# Patient Record
Sex: Female | Born: 1985 | Hispanic: Yes | Marital: Single | Smoking: Former smoker
Health system: Southern US, Community
[De-identification: ages and names within clinical notes are randomized; demographics above are authoritative.]

## PROBLEM LIST (undated history)

## (undated) HISTORY — PX: TONSILLECTOMY: SUR1361

---

## 2014-01-21 ENCOUNTER — Encounter (HOSPITAL_COMMUNITY): Payer: Self-pay | Admitting: Emergency Medicine

## 2014-01-21 ENCOUNTER — Emergency Department (HOSPITAL_COMMUNITY)
Admission: EM | Admit: 2014-01-21 | Discharge: 2014-01-21 | Disposition: A | Discharge status: Home / Self Care | Payer: Medicaid Other | Source: Home / Self Care | Attending: Emergency Medicine | Admitting: Emergency Medicine

## 2014-01-21 DIAGNOSIS — R51 Headache: Secondary | ICD-10-CM

## 2014-01-21 DIAGNOSIS — R519 Headache, unspecified: Secondary | ICD-10-CM

## 2014-01-21 DIAGNOSIS — G5600 Carpal tunnel syndrome, unspecified upper limb: Secondary | ICD-10-CM

## 2014-01-21 MED ORDER — PREDNISONE 10 MG PO TABS: ORAL_TABLET | ORAL | Status: DC

## 2014-01-21 MED ORDER — KETOROLAC TROMETHAMINE 60 MG/2ML IM SOLN
INTRAMUSCULAR | Status: AC
  Filled 2014-01-21: qty 2

## 2014-01-21 MED ORDER — KETOROLAC TROMETHAMINE 60 MG/2ML IM SOLN
60.0000 mg | Freq: Once | INTRAMUSCULAR | Status: AC
Start: 2014-01-21 — End: 2014-01-21
  Administered 2014-01-21: 60 mg via INTRAMUSCULAR

## 2014-01-21 MED ORDER — TRAMADOL HCL 50 MG PO TABS: 100.0000 mg | ORAL_TABLET | Freq: Three times a day (TID) | ORAL | Status: DC | PRN

## 2014-01-21 MED ORDER — METHYLPREDNISOLONE ACETATE 80 MG/ML IJ SUSP
80.0000 mg | Freq: Once | INTRAMUSCULAR | Status: AC
  Administered 2014-01-21: 80 mg via INTRAMUSCULAR

## 2014-01-21 MED ORDER — METHYLPREDNISOLONE ACETATE 80 MG/ML IJ SUSP
INTRAMUSCULAR | Status: AC
  Filled 2014-01-21: qty 1

## 2014-01-21 NOTE — Discharge Instructions (Signed)
Carpal tunnel syndrome is caused by compression of the median nerve in the wrist.  Most often, inflammation of the tendons that pass through the carpal tunnel and surround the median nerve is the causative factor. ° °Wear your wrist splint as much as possible, especially at night.   ° °The following exercises should be done twice daily: ° °Exercises for Carpal Tunnel Syndrome  °Wrists Exercise 1. °• Make a loose right fist, palm up, and use the left hand to press gently down against the clenched hand. °• Resist the force with the closed right hand for 5 seconds. Be sure to keep the wrist straight. °• Turn the right fist palm down, and press the knuckles against the left open palm for 5 seconds. °• Finally, turn the right palm so the thumb-side of the fist is up, and press down again for 5 seconds. °• Repeat with the left hand.  ° Exercise 2. °• Hold one hand straight up shoulder-high with fingers together and palm facing outward. (The position looks like a shoulder-high salute.) °• With the other hand, bend the hand being exercised backward with the fingers still held together and hold for 5 seconds. °• Spread the fingers and thumb open while the hand is still bent back and hold for 5 seconds. °• Repeat five times for each hand.  ° Exercise 3. (Wrist Circle) °• Hold the second and third fingers up, and close the others. °• Draw five clockwise circles in the air with the two finger tips. °• Draw five more counterclockwise circles. °• Repeat with the other hand.  °Fingers and Hand Exercise 1. °• Clench the fingers of one hand into a fist tightly. °• Release, fanning out the fingers. °• Do this five times. Repeat with the other hand.  ° Exercise 2. °• To exercise the thumb, bend it against the palm beneath the little finger, and hold for 5 seconds. °• Spread the fingers apart, palm up, and hold for 5 seconds. °• Repeat five to 10 times with each hand.  ° Exercise 3. °• Gently pull the thumb out and back and hold for 5  seconds. °• Repeat five to 10 times with each hand.  °Forearms (stretching these muscles will reduce tension in the wrist) • Place the hands together in front of the chest, fingers pointed upward in a prayer-like position. °• Keeping the palms flat together, raise the elbows to stretch the forearm muscles. °• Stretch for 10 seconds. °• Gently shake the hands limp for a few seconds to loosen them. °• Repeat frequently when the hands or arms tire from activity.  °Neck and Shoulders Exercise 1. °• Sit upright and place the right hand on top of the left shoulder. °• Hold that shoulder down, and slowly tip the head down toward the right. °• Keep the face pointed forward, or even turned slightly toward the right. °• Hold this stretch gently for 5 seconds. °• Repeat on the other side.  ° Exercise 2. °• Stand in a relaxed position with the arms at the side. °• Shrug the shoulders up, then squeeze the shoulders back, then stretch the shoulders down, and then press them forward. °• The entire exercise should take about 7 seconds.  ° ° °If you are employed in a job that requires repetitive hand or wrist movement (this includes keyboarding or use of a computer mouse) paying attention to work ergonomics is of utmost importance: ° °Preventing CTS in Keyboard Workers °Altering the way a person performs repetitive   activities may help prevent inflammation in the hand and wrist. Most of the interventions described below have been found to reduce repetitive motion problems in the muscles and tendons of the hand and arm. They may reduce the incidence of carpal tunnel syndrome, although there is no definite proof of this effect. °Replacing old tools with ergonomically designed new ones can be very helpful. °Rest Periods and Avoiding Repetition. Anyone who does repetitive tasks should begin with a short warm-up period, take frequent breaks, and avoid overexertion of the hand and finger muscles whenever possible. Employers should be urged  to vary the tasks and work content of their employees. °Taking multiple "microbreaks" (about 3 minutes each) reduces strain and discomfort without decreasing productivity. Such breaks may include the following: °• Shaking or stretching the limbs °• Leaning back in the chair °• Squeezing the shoulder blades together. °• Taking deep breaths °Good Posture. Good posture is extremely important in preventing carpal tunnel syndrome, particularly for typists and computer users. °• The worker should sit with the spine against the back of the chair with the shoulders relaxed. °• The elbows should rest along the sides of the body, with wrists straight. °• The feet should be firmly on the floor or on a footrest. °• Typing materials should be at eye level so that the neck does not bend over the work. °• Keeping the neck flexible and head upright maintains circulation and nerve function to the arms and hands. One method for finding the correct head position is the "pigeon" movement. Keeping the chin level, glide the head slowly and gently forward and backward in small movements, avoiding neck discomfort. °Good Office Furniture. Poorly designed office furniture is a major contributor to bad posture. Chairs should be adjustable for height, with a supportive backrest. Custom-designed chairs, made for people who do not fit in standard chairs, can be expensive. However, the costs are often offset by the savings in medical expenses that follow injuries related to bad posture. °Voice Recognition Software. For CTS patients who must use a computer frequently, a variety of voice recognition software packages (ViaVoice, Voice Xpress, Dragon NaturallySpeaking, IListen) are now available, enabling virtually hands-free computer use. °Keyboard and Mouse Tips. Anyone using a keyboard and mouse has some options that may help protect the hands. °• The tension of the keys should be adjusted so they can be depressed without excessive force. °• The  hands and wrists should remain in a relaxed position to avoid excessive force on the keyboard. °• A 2003 study suggested that mouse-use poses a higher risk than keyboard use. Replacing the mouse with a trackball device and the standard keyboard with a jointed-type keyboard are helpful substitutions. °• Wrist rests, which fit under most keyboards, can help keep the wrists and fingers in a comfortable position. °• Some people recommend keeping the computer mouse as close to the keyboard and the user's body as possible, to reduce shoulder muscle movement. °• The mouse should be held lightly, with the wrist and forearm relaxed. New mouse supports are also available that relieve stress on the hand and support the wrist. °• Some people cut their mouse pads in half to reduce movement. °Innovative keyboard designs may reduce hand stress: °• Alternative geometry keyboards (Microsoft Natural Keyboard, Apple Adjustable Keyboard) allow the user to adjust and modify hand positions as well as adjust key tension. Most have a split or "slanted" keyboard that places the wrists at an angle. Studies suggest they are useful in promoting a neutral position   for the wrist. °• The continuous passive motion (CPM) keyboard lifts and declines gently and automatically every 3 minutes to break tension on the hands and wrist. °• A keyless keyboard (orbiTouch) is an innovative device that uses two domes. The typist covers the domes with their hands and slides them into different positions that represent letters. °Reducing Force from Hand Tools °The force placed on the fingers, hands, and wrists by a repetitive task is an important contributor to CTS. To alleviate the effect of force on the wrist, tools and tasks should be designed so that the wrist position is the same as it would be if the arms dangled in a relaxed manner at the sides. °• No task should require the wrist to deviate from side to side or to remain flexed or highly extended for  long periods. °• The handles of hand tools such as screwdrivers, scrapers, paint brushes, and buffers should be designed so that the force of the worker's grip is distributed across the muscle between the base of the thumb and the little finger, not just in the center of the palm. °• People who need to hold tools (including pencils and steering wheels) for long periods of time should grip them as loosely as possible. °• In order to apply force appropriately, the ability to feel an object is extremely important. Tools with textured handles are helpful. °• If possible, people should avoid working at low temperatures, which reduces sensation in hands and fingers. °• Power tools and machines should be designed to minimize vibrations. °• Wearing thick gloves, when possible, may lessen the shock transmitted to the hands and wrists. ° ° ° °

## 2014-01-21 NOTE — ED Notes (Signed)
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 Onset 2 weeks ago of hand pain and numbness and pain in occipital area

## 2014-01-21 NOTE — ED Provider Notes (Signed)
Chief Complaint    Chief Complaint  Patient presents with  . Hand Pain    History of Present Illness     Virginia Leach is a 28 year old female who has had a two-week history of bilateral hand pain. 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 She's using her hands constantly during the daytime for chopping. The hand pain is very severe and it feels like electric shocks. The pain radiates up the arms towards his shoulders. She finds it hard to grasp. The pain is worse at nighttime. She's had numbness and tingling in the hands. Her neck feels sore and she's had a headache when she gets the hand pain. Sometimes she has some light sensitivity with the headaches but denies any nausea, vomiting, or phonophobia. No history of migraine headaches. She has no history of diabetes vitamin B12 deficiency.  Review of Systems     Other than as noted above, the patient denies any of the following symptoms: Systemic:  No fevers, chills, sweats, or muscle aches.  No weight loss.  Musculoskeletal:  No joint pain, arthritis, bursitis, swelling, back pain, or neck pain. Neurological:  No muscular weakness, paresthesias, headache, or trouble with speech or coordination.  No dizziness.  PMFSH    Past medical history, family history, social history, meds, and allergies were reviewed.    Physical Exam    Vital signs:  BP 123/59  Pulse 58  Temp(Src) 98.3 F (36.8 C) (Oral)  Resp 16  SpO2 97% Gen:  Alert and oriented times 3.  In no distress. Musculoskeletal: She has severe pain to palpation both over the carpal tunnels on the palms of her hands extending up into the forearms. And there is no pain over the wrist dorsally, the MCP joints, PIP, DIP joints. Elbows and shoulders have full range of motion with no pain in the neck has a full range of motion with no pain Tinel's and Phalen's signs were positive bilaterally. She is diminished muscle strength, and sensation is intact. There is no  swelling her pulses are full.  Otherwise, all joints had a full a ROM with no swelling, bruising or deformity.  No edema, pulses full. Extremities were warm and pink.  Capillary refill was brisk.  Skin:  Clear, warm and dry.  No rash. Neuro:  Alert and oriented times 3.  Muscle strength was normal.  Sensation was intact to light touch.    Course in Urgent Care Center   She was given Toradol 60 mg IM and Depo-Medrol 80 mg IM. She was placed in wrist splints bilaterally.  Assessment    The primary encounter diagnosis was Carpal tunnel syndrome. A diagnosis of Headache was also pertinent to this visit.  Plan   1.  Meds:  The following meds were prescribed:   Discharge Medication List as of 01/21/2014  3:23 PM    START taking these medications   Details  predniSONE (DELTASONE) 10 MG tablet 3 daily for 10 days, 2 daily for 10 days, then 1 daily for 10 days, Normal    traMADol (ULTRAM) 50 MG tablet Take 2 tablets (100 mg total) by mouth every 8 (eight) hours as needed., Starting 01/21/2014, Until Discontinued, Normal        2.  Patient Education/Counseling:  The patient was given appropriate handouts, self care instructions, and instructed in symptomatic relief, including rest and activity, elevation, application of ice and compression.  Given exercises to do for carpal tunnel syndrome.  3.  Follow up:  The patient was told to follow up here if no better in 3 to 4 days, or sooner if becoming worse in any way, and given some red flag symptoms such as worsening pain or new neurological symptoms which would prompt immediate return.  Follow up with Dr. Izora Ribas in 2 weeks.     Reuben Likes, MD 01/21/14 2052

## 2015-12-20 ENCOUNTER — Encounter: Payer: Self-pay | Admitting: *Deleted

## 2016-01-15 ENCOUNTER — Other Ambulatory Visit (HOSPITAL_COMMUNITY)
Admission: RE | Admit: 2016-01-15 | Discharge: 2016-01-15 | Disposition: A | Discharge status: Home / Self Care | Payer: Medicaid Other | Source: Ambulatory Visit | Attending: Family Medicine | Admitting: Family Medicine

## 2016-01-15 ENCOUNTER — Encounter: Payer: Self-pay | Admitting: Family Medicine

## 2016-01-15 ENCOUNTER — Ambulatory Visit (INDEPENDENT_AMBULATORY_CARE_PROVIDER_SITE_OTHER): Payer: Medicaid Other | Admitting: Family Medicine

## 2016-01-15 VITALS — BP 100/59 | HR 55 | Temp 98.8°F | Ht 64.0 in | Wt 196.2 lb

## 2016-01-15 DIAGNOSIS — Z3049 Encounter for surveillance of other contraceptives: Secondary | ICD-10-CM

## 2016-01-15 DIAGNOSIS — R896 Abnormal cytological findings in specimens from other organs, systems and tissues: Secondary | ICD-10-CM

## 2016-01-15 DIAGNOSIS — IMO0002 Reserved for concepts with insufficient information to code with codable children: Secondary | ICD-10-CM | POA: Insufficient documentation

## 2016-01-15 DIAGNOSIS — N871 Moderate cervical dysplasia: Secondary | ICD-10-CM | POA: Diagnosis not present

## 2016-01-15 DIAGNOSIS — Z3202 Encounter for pregnancy test, result negative: Secondary | ICD-10-CM

## 2016-01-15 DIAGNOSIS — Z3046 Encounter for surveillance of implantable subdermal contraceptive: Secondary | ICD-10-CM

## 2016-01-15 DIAGNOSIS — R87810 Cervical high risk human papillomavirus (HPV) DNA test positive: Secondary | ICD-10-CM | POA: Insufficient documentation

## 2016-01-15 DIAGNOSIS — R87610 Atypical squamous cells of undetermined significance on cytologic smear of cervix (ASC-US): Secondary | ICD-10-CM | POA: Diagnosis present

## 2016-01-15 LAB — POCT PREGNANCY, URINE: PREG TEST UR: NEGATIVE

## 2016-01-15 MED ORDER — ETONOGESTREL 68 MG ~~LOC~~ IMPL
68.0000 mg | DRUG_IMPLANT | Freq: Once | SUBCUTANEOUS | Status: AC
  Administered 2016-01-15: 68 mg via SUBCUTANEOUS

## 2016-01-15 NOTE — Addendum Note (Signed)
Addended by: Geanie Berlin on: 01/15/2016 04:12 PM   Modules accepted: Orders, Medications

## 2016-01-15 NOTE — Progress Notes (Signed)
Patient ID: Virginia Leach, female   DOB: [birth year], 30 y.o.   MRN: [id number]   GYNECOLOGY CLINIC PROCEDURE NOTE  [name] is a 30 y.o. No obstetric history on file. 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   Nexplanon Removal and Insertion  Patient identified, informed consent performed, consent signed.   Patient does understand that irregular bleeding is a very common side effect of this medication. She was advised to have backup contraception for one week after replacement of the implant. Pregnancy test in clinic today was negative.  Appropriate time out taken. Implanon site identified. Area prepped in usual sterile fashon. One ml of 1% lidocaine was used to anesthetize the area at the distal end of the implant. A small stab incision was made right beside the implant on the distal portion. The Nexplanon rod was grasped using hemostats and removed without difficulty. There was minimal blood loss. There were no complications. Area was then injected with 3 ml of 1 % lidocaine. She was re-prepped with betadine, Nexplanon removed from packaging, Device confirmed in needle, then inserted full length of needle and withdrawn per handbook instructions. Nexplanon was able to palpated in the patient's arm; patient palpated the insert herself.  There was minimal blood loss. Patient insertion site covered with guaze and a pressure bandage to reduce any bruising. The patient tolerated the procedure well and was given post procedure instructions.  She was advised to have backup contraception for one week.    Federico Flake, MD , MPH, ABFM Family Medicine, OB Fellow Center for [facility], [facility]

## 2016-01-15 NOTE — Patient Instructions (Signed)
Etonogestrel implant What is this medicine? ETONOGESTREL (et oh noe JES trel) is a contraceptive (birth control) device. It is used to prevent pregnancy. It can be used for up to 3 years. This medicine may be used for other purposes; ask your health care provider or pharmacist if you have questions. What should I tell my health care provider before I take this medicine? They need to know if you have any of these conditions: -abnormal vaginal bleeding -blood vessel disease or blood clots -cancer of the breast, cervix, or liver -depression -diabetes -gallbladder disease -headaches -heart disease or recent heart attack -high blood pressure -high cholesterol -kidney disease -liver disease -renal disease -seizures -tobacco smoker -an unusual or allergic reaction to etonogestrel, other hormones, anesthetics or antiseptics, medicines, foods, dyes, or preservatives -pregnant or trying to get pregnant -breast-feeding How should I use this medicine? This device is inserted just under the skin on the inner side of your upper arm by a health care professional. Talk to your pediatrician regarding the use of this medicine in children. Special care may be needed. Overdosage: If you think you have taken too much of this medicine contact a poison control center or emergency room at once. NOTE: This medicine is only for you. Do not share this medicine with others. What if I miss a dose? This does not apply. What may interact with this medicine? Do not take this medicine with any of the following medications: -amprenavir -bosentan -fosamprenavir This medicine may also interact with the following medications: -barbiturate medicines for inducing sleep or treating seizures -certain medicines for fungal infections like ketoconazole and itraconazole -griseofulvin -medicines to treat seizures like carbamazepine, felbamate, oxcarbazepine, phenytoin,  topiramate -modafinil -phenylbutazone -rifampin -some medicines to treat HIV infection like atazanavir, indinavir, lopinavir, nelfinavir, tipranavir, ritonavir -St. John's wort This list may not describe all possible interactions. Give your health care provider a list of all the medicines, herbs, non-prescription drugs, or dietary supplements you use. Also tell them if you smoke, drink alcohol, or use illegal drugs. Some items may interact with your medicine. What should I watch for while using this medicine? This product does not protect you against HIV infection (AIDS) or other sexually transmitted diseases. You should be able to feel the implant by pressing your fingertips over the skin where it was inserted. Contact your doctor if you cannot feel the implant, and use a non-hormonal birth control method (such as condoms) until your doctor confirms that the implant is in place. If you feel that the implant may have broken or become bent while in your arm, contact your healthcare provider. What side effects may I notice from receiving this medicine? Side effects that you should report to your doctor or health care professional as soon as possible: -allergic reactions like skin rash, itching or hives, swelling of the face, lips, or tongue -breast lumps -changes in emotions or moods -depressed mood -heavy or prolonged menstrual bleeding -pain, irritation, swelling, or bruising at the insertion site -scar at site of insertion -signs of infection at the insertion site such as fever, and skin redness, pain or discharge -signs of pregnancy -signs and symptoms of a blood clot such as breathing problems; changes in vision; chest pain; severe, sudden headache; pain, swelling, warmth in the leg; trouble speaking; sudden numbness or weakness of the face, arm or leg -signs and symptoms of liver injury like dark yellow or brown urine; general ill feeling or flu-like symptoms; light-colored stools; loss of  appetite; nausea; right upper belly   pain; unusually weak or tired; yellowing of the eyes or skin -unusual vaginal bleeding, discharge -signs and symptoms of a stroke like changes in vision; confusion; trouble speaking or understanding; severe headaches; sudden numbness or weakness of the face, arm or leg; trouble walking; dizziness; loss of balance or coordination Side effects that usually do not require medical attention (Report these to your doctor or health care professional if they continue or are bothersome.): -acne -back pain -breast pain -changes in weight -dizziness -general ill feeling or flu-like symptoms -headache -irregular menstrual bleeding -nausea -sore throat -vaginal irritation or inflammation This list may not describe all possible side effects. Call your doctor for medical advice about side effects. You may report side effects to FDA at 1-800-FDA-1088. Where should I keep my medicine? This drug is given in a hospital or clinic and will not be stored at home. NOTE: This sheet is a summary. It may not cover all possible information. If you have questions about this medicine, talk to your doctor, pharmacist, or health care provider.    2016, Elsevier/Gold Standard. (2014-08-17 14:07:06) Colposcopy Care After Colposcopy is a procedure in which a special tool is used to magnify the surface of the cervix. A tissue sample (biopsy) may also be taken. This sample will be looked at for cervical cancer or other problems. After the test:  You may have some cramping.  Lie down for a few minutes if you feel lightheaded.   You may have some bleeding which should stop in a few days. HOME CARE  Do not have sex or use tampons for 2 to 3 days or as told.  Only take medicine as told by your doctor.  Continue to take your birth control pills as usual. Finding out the results of your test Ask when your test results will be ready. Make sure you get your test results. GET HELP  RIGHT AWAY IF:  You are bleeding a lot or are passing blood clots.  You develop a fever of 102 F (38.9 C) or higher.  You have abnormal vaginal discharge.  You have cramps that do not go away with medicine.  You feel lightheaded, dizzy, or pass out (faint). MAKE SURE YOU:   Understand these instructions.  Will watch your condition.  Will get help right away if you are not doing well or get worse.   This information is not intended to replace advice given to you by your health care provider. Make sure you discuss any questions you have with your health care provider.   Document Released: 04/20/2008 Document Revised: 01/25/2012 Document Reviewed: 06/01/2013 Elsevier Interactive Patient Education 2016 Elsevier Inc. COLPOSCOPY POST-PROCEDURE INSTRUCTIONS  1. You may take Ibuprofen, Aleve or Tylenol for cramping if needed.  2. If Monsel's solution was used, you will have a black discharge.  3. Light bleeding is normal.  If bleeding is heavier than your period, please call.  4. Put nothing in your vagina until the bleeding or discharge stops (usually 2 or3 days).  5. We will call you within one week with biopsy results or discuss the results at your follow-up appointment if needed. 6.

## 2016-01-15 NOTE — Progress Notes (Signed)
Patient ID: Tamaira Ciriello, female   DOB: 07/11/1986, 30 y.o.   MRN: [id number]    GYNECOLOGY CLINIC COLPOSCOPY PROCEDURE NOTE  30 y.o. No obstetric history on file. here for colposcopy for ASCUS with POSITIVE high risk HPV pap smear on 12/13 . Discussed role for HPV in cervical dysplasia, need for surveillance.  Patient given informed consent, signed copy in the chart, time out was performed.  Placed in lithotomy position. Cervix viewed with speculum and colposcope after application of acetic acid.   Colposcopy adequate? 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  ECC specimen obtained. All specimens were labelled and sent to pathology.  Patient was given post procedure instructions.  Will follow up pathology and manage accordingly.  Routine preventative health maintenance measures emphasized.  Federico Flake, MD , MPH, ABFM Family Medicine, OB Fellow [facility]

## 2016-01-17 ENCOUNTER — Encounter: Payer: Self-pay | Admitting: Family Medicine

## 2016-01-17 DIAGNOSIS — N871 Moderate cervical dysplasia: Secondary | ICD-10-CM | POA: Insufficient documentation

## 2016-01-20 ENCOUNTER — Telehealth: Payer: Self-pay | Admitting: General Practice

## 2016-01-20 NOTE — Telephone Encounter (Signed)
Patient needs LEEP per Dr Alvester MorinNewton. Patient also needs appt for LEEP with Dr Alvester MorinNewton. Called patient, no answer- left message stating we are trying to reach you with results, please call us back at the clinics

## 2016-01-21 NOTE — Telephone Encounter (Signed)
Patient called and left message stating she is returning our call. Called patient and informed her of results and explained how LEEP appt would work. Told patient I do not have that appt scheduled yet but someone from the front office will call her to set that up. Patient verbalized understanding & had no questions

## 2016-02-11 ENCOUNTER — Other Ambulatory Visit (HOSPITAL_COMMUNITY)
Admission: RE | Admit: 2016-02-11 | Discharge: 2016-02-11 | Disposition: A | Discharge status: Home / Self Care | Payer: Medicaid Other | Source: Ambulatory Visit | Attending: Family Medicine | Admitting: Family Medicine

## 2016-02-11 ENCOUNTER — Ambulatory Visit (INDEPENDENT_AMBULATORY_CARE_PROVIDER_SITE_OTHER): Payer: Medicaid Other | Admitting: Family Medicine

## 2016-02-11 ENCOUNTER — Encounter: Payer: Self-pay | Admitting: Family Medicine

## 2016-02-11 VITALS — BP 97/34 | HR 59 | Temp 98.5°F | Wt 197.5 lb

## 2016-02-11 DIAGNOSIS — Z3202 Encounter for pregnancy test, result negative: Secondary | ICD-10-CM | POA: Diagnosis not present

## 2016-02-11 DIAGNOSIS — N871 Moderate cervical dysplasia: Secondary | ICD-10-CM | POA: Diagnosis present

## 2016-02-11 DIAGNOSIS — R896 Abnormal cytological findings in specimens from other organs, systems and tissues: Secondary | ICD-10-CM | POA: Diagnosis not present

## 2016-02-11 DIAGNOSIS — IMO0002 Reserved for concepts with insufficient information to code with codable children: Secondary | ICD-10-CM

## 2016-02-11 LAB — POCT PREGNANCY, URINE: Preg Test, Ur: NEGATIVE

## 2016-02-11 NOTE — Patient Instructions (Signed)
Loop Electrosurgical Excision Procedure  Loop electrosurgical excision procedure (LEEP) is the removal of a portion of the lower part of the uterus (cervix). The procedure is done when there are significantly abnormal cervical cell changes. Abnormal cell changes of the cervix can lead to cancer if left in place and untreated.     The LEEP procedure itself typically only takes a few minutes. Often, it may be done in your caregiver's office. The procedure is considered safe for those who wish to get pregnant or are trying to get pregnant. Only under rare circumstances should this procedure be done if you are pregnant.  LET YOUR CAREGIVER KNOW ABOUT:  · Whether you are pregnant or late for your last menstrual period.  · Allergies to foods or medicines.  · All the medicines you are taking including herbs, eyedrops, and over-the-counter medicines, and creams.  · Use of steroids (by mouth or creams).  · Previous problems with anesthetics or numbing medicine.  · Previous gynecological surgery.  · History of blood clots or bleeding problems.  · Any recent or current vaginal infections (herpes, sexually transmitted infections).  · Other health problems.  RISKS AND COMPLICATIONS  · Bleeding.  · Infection.  · Injury to the vagina, bladder, or rectum.  · Very rare obstruction of the cervical opening that causes problems during menstruation (cervical stenosis).  BEFORE THE PROCEDURE  · Do not take aspirin or blood thinners (anticoagulants) for 1 week before the procedure, or as told by your caregiver.  · Eat a light meal before the procedure.  · Ask your caregiver about changing or stopping your regular medicines.  · You may be given a pain reliever 1 or 2 hours before the procedure.  PROCEDURE   · A tool (speculum) is placed in the vagina. This allows your caregiver to see the cervix.  · An iodine stain is applied to the cervix to find the area of abnormal cells to be removed.  · Medicine is injected to numb the cervix (local  anesthetic).    · Electricity is passed through a thin wire loop which is then used to remove (cauterize) a small segment of the affected cervix.  · Light electrocautery is used to seal any small blood vessels and prevent bleeding.  · A paste may be applied to the cauterized area of the cervix to help prevent bleeding.  · The tissue sample is sent to the lab. It is examined under the microscope.  AFTER THE PROCEDURE  · Have someone drive you home.  · You may have slight to moderate cramping.  · You may notice a black vaginal discharge from the paste used on the cervix to prevent bleeding. This is normal.  · Watch for excessive bleeding. This requires immediate medical care.  · Ask when your test results will be ready. Make sure you get your test results.     This information is not intended to replace advice given to you by your health care provider. Make sure you discuss any questions you have with your health care provider.     Document Released: 01/23/2003 Document Revised: 01/25/2012 Document Reviewed: 04/14/2011  Elsevier Interactive Patient Education ©2016 [employer]

## 2016-02-11 NOTE — Progress Notes (Signed)
Patient ID: Virginia BlueJessica Renfrew, female   DOB: 12/26/1985, 30 y.o.   MRN: [id number]    GYNECOLOGY CLINIC PROCEDURE NOTE  Virginia Leach is a 30 y.o. No obstetric history on file. here for LEEP. No GYN concerns. Pap smear and colposcopy reviewed.    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  Role of HPV,cervical dysplasia and need for close followup was empasized. Informed written consent was obtained. All questions were answered. Time out performed. Urine pregnancy test was negative.  Procedure: The patient was placed in lithotomy position and the bivalved coated speculum was placed in the patient's vagina. A grounding pad placed on the patient. Acetic acid solution was applied to the cervix and areas of acetowhite changes were noted around the transformation zone.   Local anesthesia was administered via an intracervical block using 10cc of 2% Lidocaine with epinephrine. The suction was turned on and the Small 1X Fisher Cone Biopsy Excisor on 86[address] of cutting current was used to excise the area of decreased uptake and excise the entire transformation zone. Excellent hemostasis was achieved using roller ball coagulation set at 50 Watts coagulation current. Monsel's solution was then applied and the speculum was removed from the vagina. Specimens were sent to pathology.  The patient tolerated the procedure well. Post-operative instructions given to patient, including instruction to seek medical attention for persistent bright red bleeding, fever, abdominal/pelvic pain, dysuria, nausea or vomiting. She was also told about the possibility of having copious yellow to black tinged discharge for weeks. She was  counseled to avoid anything in the vagina (sex/douching/tampons) for 3 weeks. She has a 4 week post-operative check to assess wound healing, review results and discuss further management.   Federico FlakeKimberly Niles Kenzie Flakes, MD , MPH, ABFM Family Medicine, OB Fellow [facility]

## 2016-02-17 ENCOUNTER — Telehealth: Payer: Self-pay | Admitting: General Practice

## 2016-02-17 NOTE — Telephone Encounter (Signed)
Per Dr Alvester MorinNewton, LEEP procedure confirmed CIN 2 but margins were clear. No further intervention is needed at this time but she will need to have a pap smear with testing for HPV in 1 year. Patient also needs 4 week follow up appt scheduled to ensure well healing area. Called patient and informed her of results & recommendations and that someone from the front office will contact her to scheduled that 4 week f/u visit. Patient verbalized understanding & had no questions

## 2016-03-13 ENCOUNTER — Ambulatory Visit (INDEPENDENT_AMBULATORY_CARE_PROVIDER_SITE_OTHER): Payer: Medicaid Other | Admitting: Family Medicine

## 2016-03-13 ENCOUNTER — Encounter: Payer: Self-pay | Admitting: Family Medicine

## 2016-03-13 VITALS — BP 103/49 | HR 52 | Temp 98.3°F | Ht 64.0 in | Wt 193.5 lb

## 2016-03-13 DIAGNOSIS — N871 Moderate cervical dysplasia: Secondary | ICD-10-CM | POA: Diagnosis present

## 2016-03-13 NOTE — Progress Notes (Signed)
   Subjective:    Patient ID: Virginia BlueJessica Leach, female    DOB: [birth year], 30 y.o.   MRN: [id number]  HPI Patient seen for follow up of LEEP and cervical dysplasia.  Patient reports no complications: no pain, cramping, bleeding, or abnormal discharge.   Review of Systems     Objective:   Physical Exam  Constitutional: She appears well-developed and well-nourished.  Cardiovascular: Normal rate, regular rhythm and normal heart sounds.  Exam reveals no gallop and no friction rub.   No murmur heard. Pulmonary/Chest: Effort normal and breath sounds normal.  Abdominal: Soft. Bowel sounds are normal. She exhibits no distension and no mass. There is no tenderness. There is no rebound and no guarding.  Skin: Skin is warm and dry. No rash noted. No erythema. No pallor.  Psychiatric: She has a normal mood and affect. Her behavior is normal. Judgment and thought content normal.      Assessment & Plan:  1. 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  F/u 1 year for PAP and cotesting.

## 2016-05-29 ENCOUNTER — Emergency Department (HOSPITAL_COMMUNITY): Payer: Medicaid Other

## 2016-05-29 ENCOUNTER — Encounter (HOSPITAL_COMMUNITY): Payer: Self-pay | Admitting: *Deleted

## 2016-05-29 ENCOUNTER — Emergency Department (HOSPITAL_COMMUNITY)
Admission: EM | Admit: 2016-05-29 | Discharge: 2016-05-30 | Disposition: A | Discharge status: Home / Self Care | Payer: Medicaid Other | Attending: Emergency Medicine | Admitting: Emergency Medicine

## 2016-05-29 DIAGNOSIS — R51 Headache: Secondary | ICD-10-CM | POA: Diagnosis not present

## 2016-05-29 DIAGNOSIS — F17210 Nicotine dependence, cigarettes, uncomplicated: Secondary | ICD-10-CM | POA: Insufficient documentation

## 2016-05-29 DIAGNOSIS — R42 Dizziness and giddiness: Secondary | ICD-10-CM | POA: Diagnosis not present

## 2016-05-29 DIAGNOSIS — R519 Headache, unspecified: Secondary | ICD-10-CM

## 2016-05-29 DIAGNOSIS — R079 Chest pain, unspecified: Secondary | ICD-10-CM | POA: Diagnosis not present

## 2016-05-29 DIAGNOSIS — N390 Urinary tract infection, site not specified: Secondary | ICD-10-CM | POA: Insufficient documentation

## 2016-05-29 LAB — URINALYSIS, ROUTINE W REFLEX MICROSCOPIC
BILIRUBIN URINE: NEGATIVE
GLUCOSE, UA: NEGATIVE mg/dL
Hgb urine dipstick: NEGATIVE
KETONES UR: NEGATIVE mg/dL
Nitrite: NEGATIVE
Protein, ur: NEGATIVE mg/dL
Specific Gravity, Urine: 1.023 (ref 1.005–1.030)
pH: 7 (ref 5.0–8.0)

## 2016-05-29 LAB — CBC
HCT: 38.2 % (ref 36.0–46.0)
Hemoglobin: 13.1 g/dL (ref 12.0–15.0)
MCH: 30.6 pg (ref 26.0–34.0)
MCHC: 34.3 g/dL (ref 30.0–36.0)
MCV: 89.3 fL (ref 78.0–100.0)
PLATELETS: 368 10*3/uL (ref 150–400)
RBC: 4.28 MIL/uL (ref 3.87–5.11)
RDW: 13.3 % (ref 11.5–15.5)
WBC: 11.4 10*3/uL — ABNORMAL HIGH (ref 4.0–10.5)

## 2016-05-29 LAB — URINE MICROSCOPIC-ADD ON

## 2016-05-29 LAB — BASIC METABOLIC PANEL
ANION GAP: 6 (ref 5–15)
BUN: 12 mg/dL (ref 6–20)
CALCIUM: 9.3 mg/dL (ref 8.9–10.3)
CHLORIDE: 109 mmol/L (ref 101–111)
CO2: 24 mmol/L (ref 22–32)
Creatinine, Ser: 0.62 mg/dL (ref 0.44–1.00)
GFR calc Af Amer: >60 mL/min (ref >60)
GFR calc non Af Amer: >60 mL/min (ref >60)
Glucose, Bld: 99 mg/dL (ref 65–99)
Potassium: 3.7 mmol/L (ref 3.5–5.1)
Sodium: 139 mmol/L (ref 135–145)

## 2016-05-29 LAB — CBG MONITORING, ED: Glucose-Capillary: 105 mg/dL — ABNORMAL HIGH (ref 65–99)

## 2016-05-29 LAB — I-STAT TROPONIN, ED: TROPONIN I, POC: 0 ng/mL (ref 0.00–0.08)

## 2016-05-29 LAB — I-STAT BETA HCG BLOOD, ED (MC, WL, AP ONLY)

## 2016-05-29 MED ORDER — SODIUM CHLORIDE 0.9 % IV BOLUS (SEPSIS)
1000.0000 mL | Freq: Once | INTRAVENOUS | Status: AC
  Administered 2016-05-29: 1000 mL via INTRAVENOUS

## 2016-05-29 MED ORDER — KETOROLAC TROMETHAMINE 30 MG/ML IJ SOLN
30.0000 mg | Freq: Once | INTRAMUSCULAR | Status: AC
  Administered 2016-05-29: 30 mg via INTRAVENOUS
  Filled 2016-05-29: qty 1

## 2016-05-29 NOTE — ED Notes (Signed)
Pt c/o dizziness & mid intermittent CP with SOB, pt reports onset of symptoms today after work, pt ambulatory, pt A&O x4

## 2016-05-29 NOTE — ED Provider Notes (Signed)
CSN: [id number]     Arrival date & time 05/29/16  1724 History   First MD Initiated Contact with Patient 05/29/16 2157     Chief Complaint  Patient presents with  . Dizziness     (Consider location/radiation/quality/duration/timing/severity/associated sxs/prior Treatment) HPI Comments: Patient presents today with several complaints of dizziness, chest pain, headache, ear fullness, cough, generalized weakness onset earlier today at 11 AM.  She denies any SOB, fever, chills, vision changes, numbness, tingling, vomiting, or focal weakness.  She has not taken anything for symptoms prior to arrival.  She denies any history of Cardiac Disease. Nothing makes her symptoms better or worse.    Patient is a 30 y.o. female presenting with dizziness. The history is provided by the patient.  Dizziness   History reviewed. No pertinent past medical history. History reviewed. No pertinent past surgical history. No family history on file. Social History  Substance Use Topics  . Smoking status: Current Every Day Smoker -- 0.20 packs/day    Types: Cigarettes  . Smokeless tobacco: Never Used  . Alcohol Use: Yes     Comment: occasionally   OB History    Gravida Para Term Preterm AB TAB SAB Ectopic Multiple Living   2 1  1 1  1   1      Review of Systems  Neurological: Positive for dizziness.  All other systems reviewed and are negative.     Allergies  Penicillins  Home Medications   Prior to Admission medications   Medication Sig Start Date End Date Taking? Authorizing Provider  traMADol (ULTRAM) 50 MG tablet Take 2 tablets (100 mg total) by mouth every 8 (eight) hours as needed. 01/21/14   Reuben Likesavid C Keller, MD   BP 112/65 mmHg  Pulse 59  Temp(Src) 98.4 F (36.9 C)  Resp 20  Wt 91.712 kg  SpO2 98% Physical Exam  Constitutional: She appears well-developed and well-nourished.  HENT:  Head: Normocephalic and atraumatic.  Right Ear: Tympanic membrane and ear canal normal.  Left Ear:  Tympanic membrane and ear canal normal.  Nose: Right sinus exhibits no maxillary sinus tenderness and no frontal sinus tenderness. Left sinus exhibits no maxillary sinus tenderness and no frontal sinus tenderness.  Mouth/Throat: Oropharynx is clear and moist.  Eyes: EOM are normal. Pupils are equal, round, and reactive to light.  Neck: Normal range of motion. Neck supple.  Cardiovascular: Normal rate, regular rhythm and normal heart sounds.   Pulmonary/Chest: Effort normal and breath sounds normal.  Abdominal: Soft. Bowel sounds are normal. She exhibits no distension and no mass. There is no tenderness. There is no rebound and no guarding.  Musculoskeletal: Normal range of motion.  Neurological: She is alert. She has normal strength. No cranial nerve deficit or sensory deficit. She displays a negative Romberg sign. Coordination and gait normal.  Normal gait, no ataxia Normal finger to nose testing, no ataxia  Skin: Skin is warm and dry.  Psychiatric: She has a normal mood and affect.  Nursing note and vitals reviewed.   ED Course  Procedures (including critical care time) Labs Review Labs Reviewed  CBC - Abnormal; Notable for the following:    WBC 11.4 (*)    All other components within normal limits  URINALYSIS, ROUTINE W REFLEX MICROSCOPIC (NOT AT [facility]) - Abnormal; Notable for the following:    APPearance CLOUDY (*)    Leukocytes, UA MODERATE (*)    All other components within normal limits  URINE MICROSCOPIC-ADD ON - Abnormal; Notable for the  following:    Squamous Epithelial / LPF 0-5 (*)    Bacteria, UA MANY (*)    All other components within normal limits  CBG MONITORING, ED - Abnormal; Notable for the following:    Glucose-Capillary 105 (*)    All other components within normal limits  URINE CULTURE  BASIC METABOLIC PANEL    Imaging Review Dg Chest 2 View  05/29/2016  CLINICAL DATA:  30 year old female with chest pain and dizziness. EXAM: CHEST  2 VIEW COMPARISON:   None. FINDINGS: The heart size and mediastinal contours are within normal limits. Both lungs are clear. The visualized skeletal structures are unremarkable. IMPRESSION: No active cardiopulmonary disease. Electronically Signed   By: Elgie Collard M.D.   On: 05/29/2016 23:09   I have personally reviewed and evaluated these images and lab results as part of my medical decision-making.   EKG Interpretation   Date/Time:  Friday May 29 2016 18:10:10 EDT Ventricular Rate:  60 PR Interval:  142 QRS Duration: 88 QT Interval:  406 QTC Calculation: 406 R Axis:   78 Text Interpretation:  Normal sinus rhythm with sinus arrhythmia Normal ECG  No previous ECGs available Q wave in III Confirmed by LITTLE MD, RACHEL  ([id number]) on 05/31/2016 2:01:25 PM      MDM   Final diagnoses:  None   Patient presents today with several different complaints.  She had a normal neurological exam.  She is afebrile, VSS.  No ischemic changes on EKG.  Troponin negative.  CXR negative.  She is non toxic appearing.  Given IVF in the ED with improvement of symptoms.  Feel that the patient is stable for discharge.  Return precautions given.   '   Santiago Glad, PA-C 05/31/16 1735  Bethann Berkshire, MD 06/02/16 [id number]

## 2016-05-30 MED ORDER — NITROFURANTOIN MONOHYD MACRO 100 MG PO CAPS: 100.0000 mg | ORAL_CAPSULE | Freq: Two times a day (BID) | ORAL | Status: DC

## 2016-05-30 NOTE — ED Notes (Signed)
Patient Alert and oriented X4. Stable and ambulatory. Patient verbalized understanding of the discharge instructions.  Patient belongings were taken by the patient.  

## 2016-06-01 LAB — URINE CULTURE: Culture: >=100000 — AB

## 2016-06-02 ENCOUNTER — Telehealth: Payer: Self-pay | Admitting: *Deleted

## 2016-06-02 NOTE — Telephone Encounter (Signed)
Post ED Visit - Positive Culture Follow-up  Culture report reviewed by antimicrobial stewardship pharmacist:  []  Enzo BiNathan Batchelder, Pharm.D. []  Celedonio MiyamotoJeremy Frens, Pharm.D., BCPS []  Garvin FilaMike Maccia, Pharm.D. [x]  Georgina PillionElizabeth Martin, Pharm.D., BCPS []  OakwoodMinh Pham, [place]D., BCPS, AAHIVP []  Estella HuskMichelle Turner, Pharm.D., BCPS, AAHIVP []  Tennis Mustassie Stewart, [address]Pharm[address]D. []  Sherle Poeob Vincent, [place]Pharm.D.  Positive urine culture Treated with Nitrofurantoin Monohyd Macro, organism sensitive to the same and no further patient follow-up is required at this time.  Virl AxeRobertson, Brance Dartt [facility] 06/02/2016, 10:33 AM

## 2018-04-17 ENCOUNTER — Emergency Department (HOSPITAL_COMMUNITY)
Admission: EM | Admit: 2018-04-17 | Discharge: 2018-04-17 | Disposition: A | Discharge status: Home / Self Care | Payer: No Typology Code available for payment source | Attending: Emergency Medicine | Admitting: Emergency Medicine

## 2018-04-17 ENCOUNTER — Emergency Department (HOSPITAL_COMMUNITY): Payer: No Typology Code available for payment source

## 2018-04-17 ENCOUNTER — Encounter (HOSPITAL_COMMUNITY): Payer: Self-pay | Admitting: *Deleted

## 2018-04-17 DIAGNOSIS — Z87891 Personal history of nicotine dependence: Secondary | ICD-10-CM | POA: Insufficient documentation

## 2018-04-17 DIAGNOSIS — M25511 Pain in right shoulder: Secondary | ICD-10-CM | POA: Insufficient documentation

## 2018-04-17 DIAGNOSIS — S161XXA Strain of muscle, fascia and tendon at neck level, initial encounter: Secondary | ICD-10-CM | POA: Diagnosis not present

## 2018-04-17 DIAGNOSIS — Y92410 Unspecified street and highway as the place of occurrence of the external cause: Secondary | ICD-10-CM | POA: Insufficient documentation

## 2018-04-17 DIAGNOSIS — Y939 Activity, unspecified: Secondary | ICD-10-CM | POA: Insufficient documentation

## 2018-04-17 DIAGNOSIS — M545 Low back pain, unspecified: Secondary | ICD-10-CM

## 2018-04-17 DIAGNOSIS — Y998 Other external cause status: Secondary | ICD-10-CM | POA: Insufficient documentation

## 2018-04-17 DIAGNOSIS — S199XXA Unspecified injury of neck, initial encounter: Secondary | ICD-10-CM | POA: Diagnosis present

## 2018-04-17 MED ORDER — METHOCARBAMOL 500 MG PO TABS
500.0000 mg | ORAL_TABLET | Freq: Once | ORAL | Status: AC
  Administered 2018-04-17: 500 mg via ORAL
  Filled 2018-04-17: qty 1

## 2018-04-17 MED ORDER — KETOROLAC TROMETHAMINE 15 MG/ML IJ SOLN
30.0000 mg | Freq: Once | INTRAMUSCULAR | Status: AC
  Administered 2018-04-17: 30 mg via INTRAMUSCULAR
  Filled 2018-04-17: qty 2

## 2018-04-17 MED ORDER — METHOCARBAMOL 500 MG PO TABS: 500.0000 mg | ORAL_TABLET | Freq: Four times a day (QID) | ORAL | 0 refills | Status: DC | PRN
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
## 2018-04-17 NOTE — ED Notes (Signed)
Patient transported to X-ray 

## 2018-04-17 NOTE — Discharge Instructions (Signed)
Take Ibuprofen three times daily for the next week. Take this medicine with food. °Take muscle relaxer at bedtime to help you sleep. This medicine makes you drowsy so do not take before driving or work °Use a heating pad for sore muscles - use for 20 minutes several times a day °Return for worsening symptoms ° °

## 2018-04-17 NOTE — ED Notes (Signed)
ED Provider at bedside. 
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
## 2018-08-02 ENCOUNTER — Other Ambulatory Visit: Payer: Self-pay | Admitting: Nurse Practitioner

## 2018-08-02 DIAGNOSIS — N6310 Unspecified lump in the right breast, unspecified quadrant: Secondary | ICD-10-CM

## 2019-05-10 DIAGNOSIS — M549 Dorsalgia, unspecified: Secondary | ICD-10-CM | POA: Diagnosis not present

## 2019-05-10 DIAGNOSIS — G43909 Migraine, unspecified, not intractable, without status migrainosus: Secondary | ICD-10-CM | POA: Diagnosis not present

## 2019-09-26 DIAGNOSIS — Z20828 Contact with and (suspected) exposure to other viral communicable diseases: Secondary | ICD-10-CM | POA: Diagnosis not present

## 2019-10-06 DIAGNOSIS — Z20828 Contact with and (suspected) exposure to other viral communicable diseases: Secondary | ICD-10-CM | POA: Diagnosis not present

## 2019-10-06 DIAGNOSIS — Z7189 Other specified counseling: Secondary | ICD-10-CM | POA: Diagnosis not present

## 2019-10-07 DIAGNOSIS — Z03818 Encounter for observation for suspected exposure to other biological agents ruled out: Secondary | ICD-10-CM | POA: Diagnosis not present

## 2020-01-05 ENCOUNTER — Ambulatory Visit (INDEPENDENT_AMBULATORY_CARE_PROVIDER_SITE_OTHER): Payer: Medicaid Other | Admitting: Family Medicine

## 2020-01-05 ENCOUNTER — Other Ambulatory Visit: Payer: Self-pay

## 2020-01-05 ENCOUNTER — Encounter: Payer: Self-pay | Admitting: Family Medicine

## 2020-01-05 VITALS — BP 116/64 | HR 51 | Wt 200.3 lb

## 2020-01-05 DIAGNOSIS — Z3046 Encounter for surveillance of implantable subdermal contraceptive: Secondary | ICD-10-CM

## 2020-01-05 DIAGNOSIS — Z3202 Encounter for pregnancy test, result negative: Secondary | ICD-10-CM | POA: Diagnosis not present

## 2020-01-05 DIAGNOSIS — Z3009 Encounter for other general counseling and advice on contraception: Secondary | ICD-10-CM | POA: Diagnosis not present

## 2020-01-05 DIAGNOSIS — N871 Moderate cervical dysplasia: Secondary | ICD-10-CM

## 2020-01-05 DIAGNOSIS — E282 Polycystic ovarian syndrome: Secondary | ICD-10-CM | POA: Diagnosis not present

## 2020-01-05 DIAGNOSIS — Z3169 Encounter for other general counseling and advice on procreation: Secondary | ICD-10-CM

## 2020-01-05 LAB — POCT PREGNANCY, URINE: Preg Test, Ur: NEGATIVE

## 2020-01-05 MED ORDER — PREPLUS 27-1 MG PO TABS: 1.0000 | ORAL_TABLET | Freq: Every day | ORAL | 13 refills | Status: AC

## 2020-01-05 NOTE — Progress Notes (Signed)
     GYNECOLOGY CLINIC note+ PROCEDURE NOTE  Virginia Leach is a 34 y.o. [phone] here for Nexplanon removal.  Last pap smear was on 2020 at PCP and was normal per report.  She reports a history of PCOS and last child was 13 years ago.  No other gynecologic concerns.   BP 116/64   Pulse (!) 51   Wt 200 lb 4.8 oz (90.9 kg)   BMI 34.38 kg/m    Nexplanon Removal Patient identified, informed consent performed, consent signed.   Appropriate time out taken. Nexplanon site identified.  Area prepped in usual sterile fashon. One ml of 1% lidocaine was used to anesthetize the area at the distal end of the implant. A small stab incision was made right beside the implant on the distal portion.  The Nexplanon rod was grasped using hemostats and removed without difficulty.  There was minimal blood loss. There were no complications.  3 ml of 1% lidocaine was injected around the incision for post-procedure analgesia.  Steri-strips were applied over the small incision.  A pressure bandage was applied to reduce any bruising.  The patient tolerated the procedure well and was given post procedure instructions.  Patient is planning to use nothing for contraception/attempt conception.  A/P:  1. Nexplanon removal Removed esaily  2. Family planning  3. Encounter for other general counseling or advice on contraception/Preconcpetion counseling Patient is planning on conceiving. We reviewed her current problems and medications in terms of pregnancy safety. Additionally discussed fertility awareness, when to take a pregnancy test, and starting a prenatal vitamin. We discussed general early pregnancy precautions. Reviewed services at the office regarding prenatal care. She voiced understanding.  Problem specific recommendations are: Weight loss Labs for baseline Discussed fertility awareness   Reviewed plan for pregnancy Discussed checking UPT monthly vs every 3 months given known hx of irregular periods and  last pregnancy was not diagnosed until 7 months due to irregular cycles Recommended starting PNV ASAP- rx sent to pharmacy on file.   4. Negative pregnancy test   5. PCOS (polycystic ovarian syndrome) Discussed baseline labs to help with pregnancy and know status of insulin resistance.  - TSH - Hemoglobin A1c     Federico Flake, MD [facility] [employer], [facility]

## 2020-01-06 LAB — HEMOGLOBIN A1C
Est. average glucose Bld gHb Est-mCnc: 117 mg/dL
Hgb A1c MFr Bld: 5.7 % — ABNORMAL HIGH (ref 4.8–5.6)

## 2020-01-06 LAB — TSH: TSH: 0.938 u[IU]/mL (ref 0.450–4.500)

## 2020-01-09 ENCOUNTER — Telehealth: Payer: Self-pay | Admitting: Lactation Services

## 2020-01-09 NOTE — Telephone Encounter (Signed)
-----   Message from Federico Flake, MD sent at 01/08/2020  5:39 PM EST ----- Patient has prediabetes. Please call and see if interested in nutritional counseling to reduce risk.

## 2020-01-09 NOTE — Telephone Encounter (Signed)
Called pt to inform her of her A1C results and to offer Nutritional Counseling. LM for patient to call the office for results.

## 2020-01-10 NOTE — Telephone Encounter (Signed)
Called patient and informed her of A1C results and offered nutrition counseling. Patient verbalized understanding and states she only has FP medicaid and that service wouldn't be covered. Discussed with patient to look up prediabetes diet and decreasing your A1C online. Recommended some form of exercise even walking for 30 minutes most days of the week. Advised she inform [facility] doctor as well. Patient verbalized understanding and asked about TSH results. Informed patient of normal results. Patient verbalized understanding & had no questions.

## 2020-03-11 ENCOUNTER — Encounter: Payer: Self-pay | Admitting: *Deleted

## 2020-05-16 DIAGNOSIS — Z419 Encounter for procedure for purposes other than remedying health state, unspecified: Secondary | ICD-10-CM | POA: Diagnosis not present

## 2020-06-06 DIAGNOSIS — S81812A Laceration without foreign body, left lower leg, initial encounter: Secondary | ICD-10-CM | POA: Diagnosis not present

## 2020-06-06 DIAGNOSIS — W25XXXA Contact with sharp glass, initial encounter: Secondary | ICD-10-CM | POA: Diagnosis not present

## 2020-06-06 DIAGNOSIS — Z23 Encounter for immunization: Secondary | ICD-10-CM | POA: Diagnosis not present

## 2020-06-06 DIAGNOSIS — Y999 Unspecified external cause status: Secondary | ICD-10-CM | POA: Diagnosis not present

## 2020-06-16 DIAGNOSIS — Z419 Encounter for procedure for purposes other than remedying health state, unspecified: Secondary | ICD-10-CM | POA: Diagnosis not present

## 2020-07-17 DIAGNOSIS — Z419 Encounter for procedure for purposes other than remedying health state, unspecified: Secondary | ICD-10-CM | POA: Diagnosis not present

## 2020-08-16 DIAGNOSIS — Z419 Encounter for procedure for purposes other than remedying health state, unspecified: Secondary | ICD-10-CM | POA: Diagnosis not present

## 2020-09-16 DIAGNOSIS — Z419 Encounter for procedure for purposes other than remedying health state, unspecified: Secondary | ICD-10-CM | POA: Diagnosis not present

## 2020-10-16 DIAGNOSIS — Z419 Encounter for procedure for purposes other than remedying health state, unspecified: Secondary | ICD-10-CM | POA: Diagnosis not present

## 2020-10-22 DIAGNOSIS — T304 Corrosion of unspecified body region, unspecified degree: Secondary | ICD-10-CM | POA: Diagnosis not present

## 2020-10-22 DIAGNOSIS — Y990 Civilian activity done for income or pay: Secondary | ICD-10-CM | POA: Diagnosis not present

## 2020-11-16 DIAGNOSIS — Z419 Encounter for procedure for purposes other than remedying health state, unspecified: Secondary | ICD-10-CM | POA: Diagnosis not present

## 2020-12-17 DIAGNOSIS — Z419 Encounter for procedure for purposes other than remedying health state, unspecified: Secondary | ICD-10-CM | POA: Diagnosis not present

## 2021-01-14 DIAGNOSIS — Z419 Encounter for procedure for purposes other than remedying health state, unspecified: Secondary | ICD-10-CM | POA: Diagnosis not present

## 2021-02-14 DIAGNOSIS — Z419 Encounter for procedure for purposes other than remedying health state, unspecified: Secondary | ICD-10-CM | POA: Diagnosis not present

## 2021-03-16 DIAGNOSIS — Z419 Encounter for procedure for purposes other than remedying health state, unspecified: Secondary | ICD-10-CM | POA: Diagnosis not present

## 2021-03-26 DIAGNOSIS — H6692 Otitis media, unspecified, left ear: Secondary | ICD-10-CM | POA: Diagnosis not present

## 2021-03-26 DIAGNOSIS — J302 Other seasonal allergic rhinitis: Secondary | ICD-10-CM | POA: Diagnosis not present

## 2021-03-26 DIAGNOSIS — L0390 Cellulitis, unspecified: Secondary | ICD-10-CM | POA: Diagnosis not present

## 2021-04-16 DIAGNOSIS — Z419 Encounter for procedure for purposes other than remedying health state, unspecified: Secondary | ICD-10-CM | POA: Diagnosis not present

## 2021-05-16 DIAGNOSIS — Z419 Encounter for procedure for purposes other than remedying health state, unspecified: Secondary | ICD-10-CM | POA: Diagnosis not present

## 2021-06-14 DIAGNOSIS — R410 Disorientation, unspecified: Secondary | ICD-10-CM | POA: Diagnosis not present

## 2021-06-14 DIAGNOSIS — S199XXA Unspecified injury of neck, initial encounter: Secondary | ICD-10-CM | POA: Diagnosis not present

## 2021-06-14 DIAGNOSIS — R55 Syncope and collapse: Secondary | ICD-10-CM | POA: Diagnosis not present

## 2021-06-14 DIAGNOSIS — S0003XA Contusion of scalp, initial encounter: Secondary | ICD-10-CM | POA: Diagnosis not present

## 2021-06-14 DIAGNOSIS — S060X9A Concussion with loss of consciousness of unspecified duration, initial encounter: Secondary | ICD-10-CM | POA: Diagnosis not present

## 2021-06-14 DIAGNOSIS — R404 Transient alteration of awareness: Secondary | ICD-10-CM | POA: Diagnosis not present

## 2021-06-14 DIAGNOSIS — R519 Headache, unspecified: Secondary | ICD-10-CM | POA: Diagnosis not present

## 2021-06-14 DIAGNOSIS — Z743 Need for continuous supervision: Secondary | ICD-10-CM | POA: Diagnosis not present

## 2021-06-14 DIAGNOSIS — S42011A Anterior displaced fracture of sternal end of right clavicle, initial encounter for closed fracture: Secondary | ICD-10-CM | POA: Diagnosis not present

## 2021-06-14 DIAGNOSIS — S299XXA Unspecified injury of thorax, initial encounter: Secondary | ICD-10-CM | POA: Diagnosis not present

## 2021-06-14 DIAGNOSIS — M549 Dorsalgia, unspecified: Secondary | ICD-10-CM | POA: Diagnosis not present

## 2021-06-14 DIAGNOSIS — S20411A Abrasion of right back wall of thorax, initial encounter: Secondary | ICD-10-CM | POA: Diagnosis not present

## 2021-06-14 DIAGNOSIS — S0990XA Unspecified injury of head, initial encounter: Secondary | ICD-10-CM | POA: Diagnosis not present

## 2021-06-14 DIAGNOSIS — M25571 Pain in right ankle and joints of right foot: Secondary | ICD-10-CM | POA: Diagnosis not present

## 2021-06-14 DIAGNOSIS — S3993XA Unspecified injury of pelvis, initial encounter: Secondary | ICD-10-CM | POA: Diagnosis not present

## 2021-06-14 DIAGNOSIS — R079 Chest pain, unspecified: Secondary | ICD-10-CM | POA: Diagnosis not present

## 2021-06-14 DIAGNOSIS — M25542 Pain in joints of left hand: Secondary | ICD-10-CM | POA: Diagnosis not present

## 2021-06-14 DIAGNOSIS — M79642 Pain in left hand: Secondary | ICD-10-CM | POA: Diagnosis not present

## 2021-06-14 DIAGNOSIS — S300XXA Contusion of lower back and pelvis, initial encounter: Secondary | ICD-10-CM | POA: Diagnosis not present

## 2021-06-14 DIAGNOSIS — R109 Unspecified abdominal pain: Secondary | ICD-10-CM | POA: Diagnosis not present

## 2021-06-14 DIAGNOSIS — M542 Cervicalgia: Secondary | ICD-10-CM | POA: Diagnosis not present

## 2021-06-14 DIAGNOSIS — S42024A Nondisplaced fracture of shaft of right clavicle, initial encounter for closed fracture: Secondary | ICD-10-CM | POA: Diagnosis not present

## 2021-06-14 DIAGNOSIS — S6992XA Unspecified injury of left wrist, hand and finger(s), initial encounter: Secondary | ICD-10-CM | POA: Diagnosis not present

## 2021-06-16 ENCOUNTER — Telehealth: Payer: Self-pay

## 2021-06-16 DIAGNOSIS — Z419 Encounter for procedure for purposes other than remedying health state, unspecified: Secondary | ICD-10-CM | POA: Diagnosis not present

## 2021-06-16 NOTE — Telephone Encounter (Signed)
Transition Care Management Follow-up Telephone Call Date of discharge and from where: 06/14/2021-[facility] How have you been since you were released from the hospital? Patient stated she is doing fine, she's alive.  Any questions or concerns? Yes  Items Reviewed: Did the pt receive and understand the discharge instructions provided? Yes  Medications obtained and verified? Yes  Other? No  Any new allergies since your discharge? No  Dietary orders reviewed? N/A Do you have support at home? Yes   Home Care and Equipment/Supplies: Were home health services ordered? not applicable If so, what is the name of the agency? N/A  Has the agency set up a time to come to the patient's home? not applicable Were any new equipment or medical supplies ordered?  No What is the name of the medical supply agency? N/A Were you able to get the supplies/equipment? not applicable Do you have any questions related to the use of the equipment or supplies? No  Functional Questionnaire: (I = Independent and D = Dependent) ADLs: I  Bathing/Dressing- I  Meal Prep- I  Eating- I  Maintaining continence- I  Transferring/Ambulation- I  Managing Meds- I  Follow up appointments reviewed:  PCP Hospital f/u appt confirmed? No   Specialist Hospital f/u appt confirmed? No   Are transportation arrangements needed? No  If their condition worsens, is the pt aware to call PCP or go to the Emergency Dept.? Yes Was the patient provided with contact information for the PCP's office or ED? Yes Was to pt encouraged to call back with questions or concerns? Yes

## 2021-06-20 DIAGNOSIS — M791 Myalgia, unspecified site: Secondary | ICD-10-CM | POA: Diagnosis not present

## 2021-06-20 DIAGNOSIS — S0990XA Unspecified injury of head, initial encounter: Secondary | ICD-10-CM | POA: Diagnosis not present

## 2021-06-20 DIAGNOSIS — S42009A Fracture of unspecified part of unspecified clavicle, initial encounter for closed fracture: Secondary | ICD-10-CM | POA: Diagnosis not present

## 2021-06-20 DIAGNOSIS — T148XXA Other injury of unspecified body region, initial encounter: Secondary | ICD-10-CM | POA: Diagnosis not present

## 2021-06-30 DIAGNOSIS — S42009A Fracture of unspecified part of unspecified clavicle, initial encounter for closed fracture: Secondary | ICD-10-CM | POA: Diagnosis not present

## 2021-06-30 DIAGNOSIS — M25532 Pain in left wrist: Secondary | ICD-10-CM | POA: Diagnosis not present

## 2021-06-30 DIAGNOSIS — M25511 Pain in right shoulder: Secondary | ICD-10-CM | POA: Diagnosis not present

## 2021-06-30 DIAGNOSIS — R112 Nausea with vomiting, unspecified: Secondary | ICD-10-CM | POA: Diagnosis not present

## 2021-07-02 ENCOUNTER — Ambulatory Visit
Admission: RE | Admit: 2021-07-02 | Discharge: 2021-07-02 | Disposition: A | Discharge status: Home / Self Care | Payer: Medicaid Other | Source: Ambulatory Visit | Attending: Family Medicine | Admitting: Family Medicine

## 2021-07-02 ENCOUNTER — Other Ambulatory Visit: Payer: Self-pay | Admitting: Family Medicine

## 2021-07-02 DIAGNOSIS — M25532 Pain in left wrist: Secondary | ICD-10-CM

## 2021-07-07 ENCOUNTER — Encounter: Payer: Self-pay | Admitting: Physician Assistant

## 2021-07-07 ENCOUNTER — Other Ambulatory Visit: Payer: Self-pay

## 2021-07-07 ENCOUNTER — Ambulatory Visit (INDEPENDENT_AMBULATORY_CARE_PROVIDER_SITE_OTHER): Payer: Medicaid Other | Admitting: Physician Assistant

## 2021-07-07 ENCOUNTER — Ambulatory Visit (INDEPENDENT_AMBULATORY_CARE_PROVIDER_SITE_OTHER): Payer: Medicaid Other

## 2021-07-07 VITALS — Ht 63.0 in | Wt 193.0 lb

## 2021-07-07 DIAGNOSIS — M25511 Pain in right shoulder: Secondary | ICD-10-CM
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
## 2021-07-17 DIAGNOSIS — Z419 Encounter for procedure for purposes other than remedying health state, unspecified: Secondary | ICD-10-CM | POA: Diagnosis not present

## 2021-08-16 DIAGNOSIS — Z419 Encounter for procedure for purposes other than remedying health state, unspecified: Secondary | ICD-10-CM | POA: Diagnosis not present

## 2021-09-16 DIAGNOSIS — Z419 Encounter for procedure for purposes other than remedying health state, unspecified: Secondary | ICD-10-CM | POA: Diagnosis not present

## 2021-10-16 DIAGNOSIS — Z419 Encounter for procedure for purposes other than remedying health state, unspecified: Secondary | ICD-10-CM | POA: Diagnosis not present

## 2021-11-16 DIAGNOSIS — Z419 Encounter for procedure for purposes other than remedying health state, unspecified: Secondary | ICD-10-CM | POA: Diagnosis not present

## 2021-12-17 DIAGNOSIS — Z419 Encounter for procedure for purposes other than remedying health state, unspecified: Secondary | ICD-10-CM | POA: Diagnosis not present

## 2022-01-14 DIAGNOSIS — Z419 Encounter for procedure for purposes other than remedying health state, unspecified: Secondary | ICD-10-CM | POA: Diagnosis not present

## 2022-02-14 DIAGNOSIS — Z419 Encounter for procedure for purposes other than remedying health state, unspecified: Secondary | ICD-10-CM | POA: Diagnosis not present

## 2022-03-16 DIAGNOSIS — Z419 Encounter for procedure for purposes other than remedying health state, unspecified: Secondary | ICD-10-CM | POA: Diagnosis not present

## 2022-04-16 DIAGNOSIS — Z419 Encounter for procedure for purposes other than remedying health state, unspecified: Secondary | ICD-10-CM | POA: Diagnosis not present

## 2022-05-16 DIAGNOSIS — Z419 Encounter for procedure for purposes other than remedying health state, unspecified: Secondary | ICD-10-CM | POA: Diagnosis not present

## 2022-06-16 DIAGNOSIS — Z419 Encounter for procedure for purposes other than remedying health state, unspecified: Secondary | ICD-10-CM | POA: Diagnosis not present

## 2022-07-17 DIAGNOSIS — Z419 Encounter for procedure for purposes other than remedying health state, unspecified: Secondary | ICD-10-CM | POA: Diagnosis not present

## 2022-08-16 DIAGNOSIS — Z419 Encounter for procedure for purposes other than remedying health state, unspecified: Secondary | ICD-10-CM | POA: Diagnosis not present

## 2022-08-25 DIAGNOSIS — R87610 Atypical squamous cells of undetermined significance on cytologic smear of cervix (ASC-US): Secondary | ICD-10-CM | POA: Diagnosis not present

## 2022-08-25 DIAGNOSIS — Z1329 Encounter for screening for other suspected endocrine disorder: Secondary | ICD-10-CM | POA: Diagnosis not present

## 2022-08-25 DIAGNOSIS — Z113 Encounter for screening for infections with a predominantly sexual mode of transmission: Secondary | ICD-10-CM | POA: Diagnosis not present

## 2022-08-25 DIAGNOSIS — Z124 Encounter for screening for malignant neoplasm of cervix: Secondary | ICD-10-CM | POA: Diagnosis not present

## 2022-08-25 DIAGNOSIS — Z8041 Family history of malignant neoplasm of ovary: Secondary | ICD-10-CM | POA: Diagnosis not present

## 2022-08-25 DIAGNOSIS — E669 Obesity, unspecified: Secondary | ICD-10-CM | POA: Diagnosis not present

## 2022-08-25 DIAGNOSIS — Z0000 Encounter for general adult medical examination without abnormal findings: Secondary | ICD-10-CM | POA: Diagnosis not present

## 2022-08-25 DIAGNOSIS — L680 Hirsutism: Secondary | ICD-10-CM | POA: Diagnosis not present

## 2022-09-09 DIAGNOSIS — L680 Hirsutism: Secondary | ICD-10-CM | POA: Diagnosis not present

## 2022-09-09 DIAGNOSIS — Z8041 Family history of malignant neoplasm of ovary: Secondary | ICD-10-CM | POA: Diagnosis not present

## 2022-09-09 DIAGNOSIS — R891 Abnormal level of hormones in specimens from other organs, systems and tissues: Secondary | ICD-10-CM | POA: Diagnosis not present

## 2022-09-09 DIAGNOSIS — A5900 Urogenital trichomoniasis, unspecified: Secondary | ICD-10-CM | POA: Diagnosis not present

## 2022-09-09 DIAGNOSIS — E282 Polycystic ovarian syndrome: Secondary | ICD-10-CM | POA: Diagnosis not present

## 2022-09-16 DIAGNOSIS — Z419 Encounter for procedure for purposes other than remedying health state, unspecified: Secondary | ICD-10-CM | POA: Diagnosis not present

## 2022-09-22 DIAGNOSIS — A5900 Urogenital trichomoniasis, unspecified: Secondary | ICD-10-CM | POA: Diagnosis not present

## 2022-10-15 DIAGNOSIS — Z8041 Family history of malignant neoplasm of ovary: Secondary | ICD-10-CM | POA: Diagnosis not present

## 2022-10-15 DIAGNOSIS — N870 Mild cervical dysplasia: Secondary | ICD-10-CM | POA: Diagnosis not present

## 2022-10-15 DIAGNOSIS — L680 Hirsutism: Secondary | ICD-10-CM | POA: Diagnosis not present

## 2022-10-15 DIAGNOSIS — R87610 Atypical squamous cells of undetermined significance on cytologic smear of cervix (ASC-US): Secondary | ICD-10-CM | POA: Diagnosis not present

## 2022-10-15 DIAGNOSIS — Z3202 Encounter for pregnancy test, result negative: Secondary | ICD-10-CM | POA: Diagnosis not present

## 2022-10-16 DIAGNOSIS — Z419 Encounter for procedure for purposes other than remedying health state, unspecified: Secondary | ICD-10-CM | POA: Diagnosis not present

## 2022-11-16 DIAGNOSIS — Z419 Encounter for procedure for purposes other than remedying health state, unspecified: Secondary | ICD-10-CM | POA: Diagnosis not present

## 2022-12-17 DIAGNOSIS — Z419 Encounter for procedure for purposes other than remedying health state, unspecified: Secondary | ICD-10-CM | POA: Diagnosis not present

## 2023-01-15 DIAGNOSIS — Z419 Encounter for procedure for purposes other than remedying health state, unspecified: Secondary | ICD-10-CM | POA: Diagnosis not present

## 2023-01-20 DIAGNOSIS — M545 Low back pain, unspecified: Secondary | ICD-10-CM | POA: Diagnosis not present

## 2023-01-20 DIAGNOSIS — Z3200 Encounter for pregnancy test, result unknown: Secondary | ICD-10-CM | POA: Diagnosis not present

## 2023-01-20 DIAGNOSIS — Z6835 Body mass index (BMI) 35.0-35.9, adult: Secondary | ICD-10-CM | POA: Diagnosis not present

## 2023-01-20 DIAGNOSIS — Z9181 History of falling: Secondary | ICD-10-CM | POA: Diagnosis not present

## 2023-01-20 DIAGNOSIS — G5603 Carpal tunnel syndrome, bilateral upper limbs: Secondary | ICD-10-CM | POA: Diagnosis not present

## 2023-01-20 DIAGNOSIS — W19XXXA Unspecified fall, initial encounter: Secondary | ICD-10-CM | POA: Diagnosis not present

## 2023-01-20 DIAGNOSIS — Y92009 Unspecified place in unspecified non-institutional (private) residence as the place of occurrence of the external cause: Secondary | ICD-10-CM | POA: Diagnosis not present

## 2023-01-20 DIAGNOSIS — Z79899 Other long term (current) drug therapy: Secondary | ICD-10-CM | POA: Diagnosis not present

## 2023-01-20 DIAGNOSIS — F17210 Nicotine dependence, cigarettes, uncomplicated: Secondary | ICD-10-CM | POA: Diagnosis not present

## 2023-02-15 DIAGNOSIS — Z419 Encounter for procedure for purposes other than remedying health state, unspecified: Secondary | ICD-10-CM | POA: Diagnosis not present

## 2023-03-17 DIAGNOSIS — Z419 Encounter for procedure for purposes other than remedying health state, unspecified: Secondary | ICD-10-CM | POA: Diagnosis not present

## 2023-04-17 DIAGNOSIS — Z419 Encounter for procedure for purposes other than remedying health state, unspecified: Secondary | ICD-10-CM | POA: Diagnosis not present

## 2023-05-17 DIAGNOSIS — Z419 Encounter for procedure for purposes other than remedying health state, unspecified: Secondary | ICD-10-CM | POA: Diagnosis not present
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
## 2023-06-17 DIAGNOSIS — Z419 Encounter for procedure for purposes other than remedying health state, unspecified: Secondary | ICD-10-CM | POA: Diagnosis not present

## 2023-07-09 DIAGNOSIS — L84 Corns and callosities: Secondary | ICD-10-CM | POA: Diagnosis not present

## 2023-07-09 DIAGNOSIS — M722 Plantar fascial fibromatosis: Secondary | ICD-10-CM | POA: Diagnosis not present

## 2023-07-09 DIAGNOSIS — R21 Rash and other nonspecific skin eruption: Secondary | ICD-10-CM | POA: Diagnosis not present

## 2023-07-18 DIAGNOSIS — Z419 Encounter for procedure for purposes other than remedying health state, unspecified: Secondary | ICD-10-CM | POA: Diagnosis not present

## 2023-07-27 DIAGNOSIS — L989 Disorder of the skin and subcutaneous tissue, unspecified: Secondary | ICD-10-CM | POA: Diagnosis not present

## 2023-07-27 DIAGNOSIS — Z6834 Body mass index (BMI) 34.0-34.9, adult: Secondary | ICD-10-CM | POA: Diagnosis not present

## 2023-08-17 DIAGNOSIS — Z419 Encounter for procedure for purposes other than remedying health state, unspecified: Secondary | ICD-10-CM | POA: Diagnosis not present

## 2023-09-17 DIAGNOSIS — Z419 Encounter for procedure for purposes other than remedying health state, unspecified: Secondary | ICD-10-CM | POA: Diagnosis not present

## 2023-10-17 DIAGNOSIS — Z419 Encounter for procedure for purposes other than remedying health state, unspecified: Secondary | ICD-10-CM | POA: Diagnosis not present

## 2023-11-17 DIAGNOSIS — Z419 Encounter for procedure for purposes other than remedying health state, unspecified: Secondary | ICD-10-CM | POA: Diagnosis not present

## 2023-12-15 DIAGNOSIS — Z8379 Family history of other diseases of the digestive system: Secondary | ICD-10-CM | POA: Diagnosis not present

## 2023-12-15 DIAGNOSIS — R1084 Generalized abdominal pain: Secondary | ICD-10-CM | POA: Diagnosis not present

## 2023-12-15 DIAGNOSIS — Z6834 Body mass index (BMI) 34.0-34.9, adult: Secondary | ICD-10-CM | POA: Diagnosis not present

## 2023-12-18 DIAGNOSIS — Z419 Encounter for procedure for purposes other than remedying health state, unspecified: Secondary | ICD-10-CM | POA: Diagnosis not present

## 2023-12-30 DIAGNOSIS — Z8379 Family history of other diseases of the digestive system: Secondary | ICD-10-CM | POA: Diagnosis not present

## 2023-12-30 DIAGNOSIS — R112 Nausea with vomiting, unspecified: Secondary | ICD-10-CM | POA: Diagnosis not present

## 2023-12-30 DIAGNOSIS — R1010 Upper abdominal pain, unspecified: Secondary | ICD-10-CM | POA: Diagnosis not present

## 2023-12-30 DIAGNOSIS — R634 Abnormal weight loss: Secondary | ICD-10-CM | POA: Diagnosis not present

## 2023-12-30 DIAGNOSIS — R194 Change in bowel habit: Secondary | ICD-10-CM | POA: Diagnosis not present

## 2023-12-30 DIAGNOSIS — K921 Melena: Secondary | ICD-10-CM | POA: Diagnosis not present

## 2023-12-30 DIAGNOSIS — R630 Anorexia: Secondary | ICD-10-CM | POA: Diagnosis not present

## 2024-01-06 DIAGNOSIS — Z1211 Encounter for screening for malignant neoplasm of colon: Secondary | ICD-10-CM | POA: Diagnosis not present

## 2024-01-06 DIAGNOSIS — R634 Abnormal weight loss: Secondary | ICD-10-CM | POA: Diagnosis not present

## 2024-01-14 DIAGNOSIS — R194 Change in bowel habit: Secondary | ICD-10-CM | POA: Diagnosis not present

## 2024-01-14 DIAGNOSIS — Z1211 Encounter for screening for malignant neoplasm of colon: Secondary | ICD-10-CM | POA: Diagnosis not present

## 2024-01-14 DIAGNOSIS — K635 Polyp of colon: Secondary | ICD-10-CM | POA: Diagnosis not present

## 2024-01-15 DIAGNOSIS — Z419 Encounter for procedure for purposes other than remedying health state, unspecified: Secondary | ICD-10-CM | POA: Diagnosis not present

## 2024-01-16 DIAGNOSIS — A048 Other specified bacterial intestinal infections: Secondary | ICD-10-CM | POA: Diagnosis not present

## 2024-01-23 DIAGNOSIS — K635 Polyp of colon: Secondary | ICD-10-CM | POA: Diagnosis not present

## 2024-02-26 DIAGNOSIS — Z419 Encounter for procedure for purposes other than remedying health state, unspecified: Secondary | ICD-10-CM | POA: Diagnosis not present

## 2024-03-15 DIAGNOSIS — Z6834 Body mass index (BMI) 34.0-34.9, adult: Secondary | ICD-10-CM | POA: Diagnosis not present

## 2024-03-15 DIAGNOSIS — R5383 Other fatigue: Secondary | ICD-10-CM | POA: Diagnosis not present

## 2024-03-15 DIAGNOSIS — E559 Vitamin D deficiency, unspecified: Secondary | ICD-10-CM | POA: Diagnosis not present

## 2024-03-15 DIAGNOSIS — Z0000 Encounter for general adult medical examination without abnormal findings: Secondary | ICD-10-CM | POA: Diagnosis not present

## 2024-03-15 DIAGNOSIS — R82998 Other abnormal findings in urine: Secondary | ICD-10-CM | POA: Diagnosis not present

## 2024-03-15 DIAGNOSIS — Z3200 Encounter for pregnancy test, result unknown: Secondary | ICD-10-CM | POA: Diagnosis not present

## 2024-03-15 DIAGNOSIS — Z7251 High risk heterosexual behavior: Secondary | ICD-10-CM | POA: Diagnosis not present

## 2024-03-21 DIAGNOSIS — Z363 Encounter for antenatal screening for malformations: Secondary | ICD-10-CM | POA: Diagnosis not present

## 2024-03-21 DIAGNOSIS — Z3480 Encounter for supervision of other normal pregnancy, unspecified trimester: Secondary | ICD-10-CM | POA: Diagnosis not present

## 2024-03-21 DIAGNOSIS — E282 Polycystic ovarian syndrome: Secondary | ICD-10-CM | POA: Diagnosis not present

## 2024-03-21 DIAGNOSIS — Z1379 Encounter for other screening for genetic and chromosomal anomalies: Secondary | ICD-10-CM | POA: Diagnosis not present

## 2024-03-21 DIAGNOSIS — R87610 Atypical squamous cells of undetermined significance on cytologic smear of cervix (ASC-US): Secondary | ICD-10-CM | POA: Diagnosis not present

## 2024-03-21 DIAGNOSIS — Z3A22 22 weeks gestation of pregnancy: Secondary | ICD-10-CM | POA: Diagnosis not present

## 2024-03-21 DIAGNOSIS — N912 Amenorrhea, unspecified: Secondary | ICD-10-CM | POA: Diagnosis not present

## 2024-03-21 DIAGNOSIS — O0930 Supervision of pregnancy with insufficient antenatal care, unspecified trimester: Secondary | ICD-10-CM | POA: Diagnosis not present

## 2024-03-27 DIAGNOSIS — Z419 Encounter for procedure for purposes other than remedying health state, unspecified: Secondary | ICD-10-CM | POA: Diagnosis not present

## 2024-03-28 DIAGNOSIS — Z3491 Encounter for supervision of normal pregnancy, unspecified, first trimester: Secondary | ICD-10-CM | POA: Diagnosis not present

## 2024-03-29 DIAGNOSIS — R7989 Other specified abnormal findings of blood chemistry: Secondary | ICD-10-CM | POA: Diagnosis not present

## 2024-03-29 DIAGNOSIS — Z6834 Body mass index (BMI) 34.0-34.9, adult: Secondary | ICD-10-CM | POA: Diagnosis not present

## 2024-03-29 DIAGNOSIS — Z3A23 23 weeks gestation of pregnancy: Secondary | ICD-10-CM | POA: Diagnosis not present

## 2024-03-29 DIAGNOSIS — E781 Pure hyperglyceridemia: Secondary | ICD-10-CM | POA: Diagnosis not present

## 2024-03-29 DIAGNOSIS — R768 Other specified abnormal immunological findings in serum: Secondary | ICD-10-CM | POA: Diagnosis not present

## 2024-03-29 DIAGNOSIS — E559 Vitamin D deficiency, unspecified: Secondary | ICD-10-CM | POA: Diagnosis not present

## 2024-04-04 DIAGNOSIS — Z3492 Encounter for supervision of normal pregnancy, unspecified, second trimester: Secondary | ICD-10-CM | POA: Diagnosis not present

## 2024-04-04 DIAGNOSIS — Z131 Encounter for screening for diabetes mellitus: Secondary | ICD-10-CM | POA: Diagnosis not present

## 2024-04-12 DIAGNOSIS — O24419 Gestational diabetes mellitus in pregnancy, unspecified control: Secondary | ICD-10-CM | POA: Diagnosis not present

## 2024-04-12 DIAGNOSIS — Z131 Encounter for screening for diabetes mellitus: Secondary | ICD-10-CM | POA: Diagnosis not present

## 2024-04-27 DIAGNOSIS — Z419 Encounter for procedure for purposes other than remedying health state, unspecified: Secondary | ICD-10-CM | POA: Diagnosis not present

## 2024-04-27 DIAGNOSIS — M5386 Other specified dorsopathies, lumbar region: Secondary | ICD-10-CM | POA: Diagnosis not present

## 2024-04-27 DIAGNOSIS — M9912 Subluxation complex (vertebral) of thoracic region: Secondary | ICD-10-CM | POA: Diagnosis not present

## 2024-04-27 DIAGNOSIS — M9913 Subluxation complex (vertebral) of lumbar region: Secondary | ICD-10-CM | POA: Diagnosis not present

## 2024-04-27 DIAGNOSIS — M9911 Subluxation complex (vertebral) of cervical region: Secondary | ICD-10-CM | POA: Diagnosis not present

## 2024-05-10 DIAGNOSIS — M9911 Subluxation complex (vertebral) of cervical region: Secondary | ICD-10-CM | POA: Diagnosis not present

## 2024-05-10 DIAGNOSIS — M9913 Subluxation complex (vertebral) of lumbar region: Secondary | ICD-10-CM | POA: Diagnosis not present

## 2024-05-10 DIAGNOSIS — M5386 Other specified dorsopathies, lumbar region: Secondary | ICD-10-CM | POA: Diagnosis not present

## 2024-05-10 DIAGNOSIS — M9912 Subluxation complex (vertebral) of thoracic region: Secondary | ICD-10-CM | POA: Diagnosis not present

## 2024-05-11 DIAGNOSIS — Z3490 Encounter for supervision of normal pregnancy, unspecified, unspecified trimester: Secondary | ICD-10-CM | POA: Diagnosis not present

## 2024-05-24 DIAGNOSIS — O24419 Gestational diabetes mellitus in pregnancy, unspecified control: Secondary | ICD-10-CM | POA: Diagnosis not present

## 2024-05-24 DIAGNOSIS — Z3689 Encounter for other specified antenatal screening: Secondary | ICD-10-CM | POA: Diagnosis not present

## 2024-05-27 DIAGNOSIS — Z419 Encounter for procedure for purposes other than remedying health state, unspecified: Secondary | ICD-10-CM | POA: Diagnosis not present

## 2024-06-08 DIAGNOSIS — O24419 Gestational diabetes mellitus in pregnancy, unspecified control: Secondary | ICD-10-CM | POA: Diagnosis not present

## 2024-06-22 DIAGNOSIS — Z3689 Encounter for other specified antenatal screening: Secondary | ICD-10-CM | POA: Diagnosis not present

## 2024-06-22 DIAGNOSIS — O24419 Gestational diabetes mellitus in pregnancy, unspecified control: Secondary | ICD-10-CM | POA: Diagnosis not present

## 2024-06-22 DIAGNOSIS — Z3685 Encounter for antenatal screening for Streptococcus B: Secondary | ICD-10-CM | POA: Diagnosis not present

## 2024-06-22 DIAGNOSIS — Z3490 Encounter for supervision of normal pregnancy, unspecified, unspecified trimester: Secondary | ICD-10-CM | POA: Diagnosis not present

## 2024-06-26 DIAGNOSIS — Z3483 Encounter for supervision of other normal pregnancy, third trimester: Secondary | ICD-10-CM | POA: Diagnosis not present

## 2024-06-26 DIAGNOSIS — Z3482 Encounter for supervision of other normal pregnancy, second trimester: Secondary | ICD-10-CM | POA: Diagnosis not present

## 2024-06-27 DIAGNOSIS — Z419 Encounter for procedure for purposes other than remedying health state, unspecified: Secondary | ICD-10-CM | POA: Diagnosis not present

## 2024-07-10 DIAGNOSIS — Z87891 Personal history of nicotine dependence: Secondary | ICD-10-CM | POA: Diagnosis not present

## 2024-07-10 DIAGNOSIS — Z3A38 38 weeks gestation of pregnancy: Secondary | ICD-10-CM | POA: Diagnosis not present

## 2024-07-10 DIAGNOSIS — O24429 Gestational diabetes mellitus in childbirth, unspecified control: Secondary | ICD-10-CM | POA: Diagnosis not present

## 2024-07-11 DIAGNOSIS — Z3A38 38 weeks gestation of pregnancy: Secondary | ICD-10-CM | POA: Diagnosis not present

## 2024-07-12 DIAGNOSIS — Z3A38 38 weeks gestation of pregnancy: Secondary | ICD-10-CM | POA: Diagnosis not present

## 2024-07-18 DIAGNOSIS — L089 Local infection of the skin and subcutaneous tissue, unspecified: Secondary | ICD-10-CM | POA: Diagnosis not present

## 2024-07-18 DIAGNOSIS — T148XXA Other injury of unspecified body region, initial encounter: Secondary | ICD-10-CM | POA: Diagnosis not present

## 2024-07-19 DIAGNOSIS — O900 Disruption of cesarean delivery wound: Secondary | ICD-10-CM | POA: Diagnosis not present

## 2024-07-19 DIAGNOSIS — N200 Calculus of kidney: Secondary | ICD-10-CM | POA: Diagnosis not present

## 2024-07-19 DIAGNOSIS — Z3A00 Weeks of gestation of pregnancy not specified: Secondary | ICD-10-CM | POA: Diagnosis not present

## 2024-07-19 DIAGNOSIS — O8600 Infection of obstetric surgical wound, unspecified: Secondary | ICD-10-CM | POA: Diagnosis not present

## 2024-07-19 DIAGNOSIS — D72829 Elevated white blood cell count, unspecified: Secondary | ICD-10-CM | POA: Diagnosis not present

## 2024-07-19 DIAGNOSIS — R1030 Lower abdominal pain, unspecified: Secondary | ICD-10-CM | POA: Diagnosis not present

## 2024-07-21 DIAGNOSIS — T148XXA Other injury of unspecified body region, initial encounter: Secondary | ICD-10-CM | POA: Diagnosis not present

## 2024-07-21 DIAGNOSIS — L089 Local infection of the skin and subcutaneous tissue, unspecified: Secondary | ICD-10-CM | POA: Diagnosis not present

## 2024-07-28 DIAGNOSIS — Z419 Encounter for procedure for purposes other than remedying health state, unspecified: Secondary | ICD-10-CM | POA: Diagnosis not present

## 2024-08-03 DIAGNOSIS — R87619 Unspecified abnormal cytological findings in specimens from cervix uteri: Secondary | ICD-10-CM | POA: Diagnosis not present

## 2024-08-03 DIAGNOSIS — Z124 Encounter for screening for malignant neoplasm of cervix: Secondary | ICD-10-CM | POA: Diagnosis not present

## 2024-08-03 DIAGNOSIS — Z4889 Encounter for other specified surgical aftercare: Secondary | ICD-10-CM | POA: Diagnosis not present

## 2024-08-09 DIAGNOSIS — R7989 Other specified abnormal findings of blood chemistry: Secondary | ICD-10-CM | POA: Diagnosis not present

## 2024-08-09 DIAGNOSIS — O24439 Gestational diabetes mellitus in the puerperium, unspecified control: Secondary | ICD-10-CM | POA: Diagnosis not present

## 2024-08-09 DIAGNOSIS — Z6832 Body mass index (BMI) 32.0-32.9, adult: Secondary | ICD-10-CM | POA: Diagnosis not present

## 2024-08-09 DIAGNOSIS — E559 Vitamin D deficiency, unspecified: Secondary | ICD-10-CM | POA: Diagnosis not present

## 2024-08-09 DIAGNOSIS — E781 Pure hyperglyceridemia: Secondary | ICD-10-CM | POA: Diagnosis not present

## 2024-08-09 DIAGNOSIS — Z79899 Other long term (current) drug therapy: Secondary | ICD-10-CM | POA: Diagnosis not present

## 2024-08-17 DIAGNOSIS — Z3046 Encounter for surveillance of implantable subdermal contraceptive: Secondary | ICD-10-CM | POA: Diagnosis not present

## 2024-08-17 DIAGNOSIS — Z3202 Encounter for pregnancy test, result negative: Secondary | ICD-10-CM | POA: Diagnosis not present

## 2024-08-17 DIAGNOSIS — Z30017 Encounter for initial prescription of implantable subdermal contraceptive: Secondary | ICD-10-CM | POA: Diagnosis not present

## 2024-08-23 DIAGNOSIS — E7800 Pure hypercholesterolemia, unspecified: Secondary | ICD-10-CM | POA: Diagnosis not present

## 2024-08-23 DIAGNOSIS — R7302 Impaired glucose tolerance (oral): Secondary | ICD-10-CM | POA: Diagnosis not present

## 2024-08-23 DIAGNOSIS — E6609 Other obesity due to excess calories: Secondary | ICD-10-CM | POA: Diagnosis not present

## 2024-08-31 DIAGNOSIS — R87619 Unspecified abnormal cytological findings in specimens from cervix uteri: Secondary | ICD-10-CM | POA: Diagnosis not present

## 2024-09-18 DIAGNOSIS — N9489 Other specified conditions associated with female genital organs and menstrual cycle: Secondary | ICD-10-CM | POA: Diagnosis not present

## 2024-09-18 DIAGNOSIS — Z793 Long term (current) use of hormonal contraceptives: Secondary | ICD-10-CM | POA: Diagnosis not present

## 2024-09-18 DIAGNOSIS — N200 Calculus of kidney: Secondary | ICD-10-CM | POA: Diagnosis not present

## 2024-09-18 DIAGNOSIS — Z87891 Personal history of nicotine dependence: Secondary | ICD-10-CM | POA: Diagnosis not present

## 2024-09-18 DIAGNOSIS — R1031 Right lower quadrant pain: Secondary | ICD-10-CM | POA: Diagnosis not present

## 2024-09-27 DIAGNOSIS — Z419 Encounter for procedure for purposes other than remedying health state, unspecified: Secondary | ICD-10-CM | POA: Diagnosis not present

## 2024-10-27 DIAGNOSIS — Z419 Encounter for procedure for purposes other than remedying health state, unspecified: Secondary | ICD-10-CM | POA: Diagnosis not present
